# Patient Record
Sex: Female | Born: 1975 | Race: Black or African American | Hispanic: No | Marital: Single | Smoking: Current every day smoker
Health system: Southern US, Community
[De-identification: ages and names within clinical notes are randomized; demographics above are authoritative.]

## PROBLEM LIST (undated history)

## (undated) DIAGNOSIS — F329 Major depressive disorder, single episode, unspecified: Secondary | ICD-10-CM

## (undated) DIAGNOSIS — K219 Gastro-esophageal reflux disease without esophagitis: Secondary | ICD-10-CM

## (undated) DIAGNOSIS — F32A Depression, unspecified: Secondary | ICD-10-CM

---

## 2011-12-31 ENCOUNTER — Emergency Department (HOSPITAL_COMMUNITY)
Admission: EM | Admit: 2011-12-31 | Discharge: 2011-12-31 | Disposition: A | Discharge status: Home / Self Care | Payer: No Typology Code available for payment source | Attending: Emergency Medicine | Admitting: Emergency Medicine

## 2011-12-31 ENCOUNTER — Emergency Department (HOSPITAL_COMMUNITY): Payer: No Typology Code available for payment source

## 2011-12-31 ENCOUNTER — Encounter (HOSPITAL_COMMUNITY): Payer: Self-pay | Admitting: *Deleted

## 2011-12-31 DIAGNOSIS — F329 Major depressive disorder, single episode, unspecified: Secondary | ICD-10-CM

## 2011-12-31 DIAGNOSIS — F3289 Other specified depressive episodes: Secondary | ICD-10-CM | POA: Insufficient documentation

## 2011-12-31 DIAGNOSIS — Y92410 Unspecified street and highway as the place of occurrence of the external cause: Secondary | ICD-10-CM | POA: Insufficient documentation

## 2011-12-31 DIAGNOSIS — F1910 Other psychoactive substance abuse, uncomplicated: Secondary | ICD-10-CM

## 2011-12-31 HISTORY — DX: Depression, unspecified: F32.A

## 2011-12-31 HISTORY — DX: Major depressive disorder, single episode, unspecified: F32.9

## 2011-12-31 HISTORY — DX: Gastro-esophageal reflux disease without esophagitis: K21.9

## 2011-12-31 LAB — COMPREHENSIVE METABOLIC PANEL
ALT: 10 U/L (ref 0–35)
AST: 15 U/L (ref 0–37)
Calcium: 9.6 mg/dL (ref 8.4–10.5)
GFR calc Af Amer: >90 mL/min (ref >90)
Sodium: 139 mEq/L (ref 135–145)
Total Protein: 7.2 g/dL (ref 6.0–8.3)

## 2011-12-31 LAB — CBC WITH DIFFERENTIAL/PLATELET
Basophils Absolute: 0 10*3/uL (ref 0.0–0.1)
Eosinophils Absolute: 0.3 10*3/uL (ref 0.0–0.7)
Eosinophils Relative: 5 % (ref 0–5)
MCH: 33.5 pg (ref 26.0–34.0)
MCHC: 34.3 g/dL (ref 30.0–36.0)
MCV: 97.6 fL (ref 78.0–100.0)
Platelets: 298 10*3/uL (ref 150–400)
RDW: 13.4 % (ref 11.5–15.5)

## 2011-12-31 LAB — RAPID URINE DRUG SCREEN, HOSP PERFORMED
Cocaine: POSITIVE — AB
Opiates: NOT DETECTED
Tetrahydrocannabinol: POSITIVE — AB

## 2011-12-31 LAB — POCT PREGNANCY, URINE: Preg Test, Ur: NEGATIVE

## 2011-12-31 LAB — URINE MICROSCOPIC-ADD ON

## 2011-12-31 LAB — URINALYSIS, ROUTINE W REFLEX MICROSCOPIC
Bilirubin Urine: NEGATIVE
Nitrite: NEGATIVE
Specific Gravity, Urine: 1.011 (ref 1.005–1.030)
pH: 6 (ref 5.0–8.0)

## 2011-12-31 LAB — ETHANOL: Alcohol, Ethyl (B): <11 mg/dL (ref 0–11)

## 2011-12-31 MED ORDER — IBUPROFEN 200 MG PO TABS
400.0000 mg | ORAL_TABLET | Freq: Once | ORAL | Status: AC
  Administered 2011-12-31: 400 mg via ORAL
  Filled 2011-12-31: qty 2
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
## 2011-12-31 NOTE — ED Notes (Signed)
Pt very tearful on arrival to room 09. No SI/HI. Pt states she has not had her depression meds in about a week.Marland KitchenMarland Kitchen
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
## 2011-12-31 NOTE — ED Notes (Signed)
Patient advised ACT member will see her at approx [phone].

## 2012-10-21 ENCOUNTER — Encounter (HOSPITAL_COMMUNITY): Payer: Self-pay | Admitting: Emergency Medicine

## 2012-10-21 ENCOUNTER — Emergency Department (INDEPENDENT_AMBULATORY_CARE_PROVIDER_SITE_OTHER)
Admission: EM | Admit: 2012-10-21 | Discharge: 2012-10-21 | Disposition: A | Discharge status: Home / Self Care | Payer: Self-pay | Source: Home / Self Care | Attending: Family Medicine | Admitting: Family Medicine

## 2012-10-21 DIAGNOSIS — G5620 Lesion of ulnar nerve, unspecified upper limb: Secondary | ICD-10-CM

## 2012-10-21 DIAGNOSIS — G5621 Lesion of ulnar nerve, right upper limb: Secondary | ICD-10-CM

## 2012-10-21 MED ORDER — KETOROLAC TROMETHAMINE 60 MG/2ML IM SOLN
60.0000 mg | Freq: Once | INTRAMUSCULAR | Status: AC
  Administered 2012-10-21: 60 mg via INTRAMUSCULAR

## 2012-10-21 MED ORDER — TRAMADOL HCL 50 MG PO TABS: 50.0000 mg | ORAL_TABLET | Freq: Four times a day (QID) | ORAL | Status: DC | PRN

## 2012-10-21 MED ORDER — METHYLPREDNISOLONE ACETATE 40 MG/ML IJ SUSP
80.0000 mg | Freq: Once | INTRAMUSCULAR | Status: AC
  Administered 2012-10-21: 80 mg via INTRAMUSCULAR

## 2012-10-21 MED ORDER — METHYLPREDNISOLONE ACETATE 80 MG/ML IJ SUSP
INTRAMUSCULAR | Status: AC
  Filled 2012-10-21: qty 1

## 2012-10-21 MED ORDER — KETOROLAC TROMETHAMINE 60 MG/2ML IM SOLN
INTRAMUSCULAR | Status: AC
  Filled 2012-10-21: qty 2

## 2012-10-21 MED ORDER — NAPROXEN 500 MG PO TABS: 500.0000 mg | ORAL_TABLET | Freq: Two times a day (BID) | ORAL | Status: DC

## 2012-10-21 NOTE — ED Provider Notes (Signed)
Medical screening examination/treatment/procedure(s) were performed by resident physician or non-physician practitioner and as supervising physician I was immediately available for consultation/collaboration.   KINDL,JAMES DOUGLAS MD.   James D Kindl, MD 10/21/12 2149 

## 2012-10-21 NOTE — ED Notes (Signed)
Pt c/o right arm/hand pain onset 3 weeks... Reports she's having pain that begins from elbow and shoots down to hand and fingers... Denies inj/trauma... 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 Alert w/no signs of acute distress.

## 2012-10-21 NOTE — ED Provider Notes (Signed)
CSN: [id number]     Arrival date & time 10/21/12  1706 History     First MD Initiated Contact with Patient 10/21/12 1749     Chief Complaint  Patient presents with  . Hand Pain   (Consider location/radiation/quality/duration/timing/severity/associated sxs/prior Treatment) HPI Comments: 37 year old female presents complaining of pain in her right forearm and hand for the past 3 weeks. 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 The pain seems to originate in the elbow and is much worse at night. She feels like her arm is swollen and very heavy. She has tried soaking it and taking BC powders but these have not helped. She has never experienced anything quite like this before. She admits to some mild pain in the left but nowhere near as bad as her right hand.  Patient is a 37 y.o. female presenting with hand pain.  Hand Pain Pertinent negatives include no chest pain, no abdominal pain and no shortness of breath.    Past Medical History  Diagnosis Date  . Depression   . GERD (gastroesophageal reflux disease)    History reviewed. No pertinent past surgical history. No family history on file. History  Substance Use Topics  . Smoking status: Not on file  . Smokeless tobacco: Not on file  . Alcohol Use: No   OB History   Grav Para Term Preterm Abortions TAB SAB Ect Mult Living                 Review of Systems  Constitutional: Negative for fever and chills.  Eyes: Negative for visual disturbance.  Respiratory: Negative for cough and shortness of breath.   Cardiovascular: Negative for chest pain, palpitations and leg swelling.  Gastrointestinal: Negative for nausea, vomiting and abdominal pain.  Endocrine: Negative for polydipsia and polyuria.  Genitourinary: Negative for dysuria, urgency and frequency.  Musculoskeletal: Negative for myalgias and arthralgias.       Arm pain, see history of present illness  Skin: Negative for rash.  Neurological:  Negative for dizziness, weakness and light-headedness.    Allergies  Review of patient's allergies indicates no known allergies.  Home Medications   Current Outpatient Rx  Name  Route  Sig  Dispense  Refill  . clonazePAM (KLONOPIN) 0.5 MG tablet   Oral   Take 0.5 mg by mouth 4 (four) times daily.         . hydrOXYzine (ATARAX/VISTARIL) 25 MG tablet   Oral   Take 25 mg by mouth at bedtime.         . naproxen (NAPROSYN) 500 MG tablet   Oral   Take 1 tablet (500 mg total) by mouth 2 (two) times daily.   60 tablet   0   . ranitidine (ZANTAC) 150 MG tablet   Oral   Take 150 mg by mouth 2 (two) times daily.         . traMADol (ULTRAM) 50 MG tablet   Oral   Take 1 tablet (50 mg total) by mouth every 6 (six) hours as needed for pain.   30 tablet   0    BP 149/87  Pulse 80  Temp(Src) 98.3 F (36.8 C) (Oral)  Resp 16  SpO2 100%  LMP 10/02/2012 Physical Exam  Constitutional: She is oriented to person, place, and time. She appears well-developed and well-nourished. No distress.  HENT:  Head: Normocephalic and atraumatic.  Neck: Normal range of motion. Neck supple.  Musculoskeletal:  Right elbow: She exhibits normal range of motion, no swelling, no effusion, no deformity and no laceration. Tenderness (positive Tinel's sign in the elbow) found. Radial head, medial epicondyle and lateral epicondyle tenderness noted.       Right wrist: She exhibits no tenderness and no swelling.  Neurological: She is alert and oriented to person, place, and time. Coordination normal.  Skin: Skin is warm and dry. No rash noted. She is not diaphoretic.  Psychiatric: She has a normal mood and affect. Judgment normal.    ED Course   Procedures (including critical care time)  Labs Reviewed - No data to display No results found. 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 With the positive  Tinel's sign, this points to cubital tunnel syndrome. Giving 80 mg of Depo-Medrol and 60 mg of Toradol now. Start naproxen twice a day and tramadol as needed. She will followup with orthopedics if this does not improve.   Meds ordered this encounter  Medications  . naproxen (NAPROSYN) 500 MG tablet    Sig: Take 1 tablet (500 mg total) by mouth 2 (two) times daily.    Dispense:  60 tablet    Refill:  0  . traMADol (ULTRAM) 50 MG tablet    Sig: Take 1 tablet (50 mg total) by mouth every 6 (six) hours as needed for pain.    Dispense:  30 tablet    Refill:  0  . ketorolac (TORADOL) injection 60 mg    Sig:   . methylPREDNISolone acetate (DEPO-MEDROL) injection 80 mg    Sig:      Graylon Good, PA-C 10/21/12 1840

## 2013-03-17 ENCOUNTER — Emergency Department (INDEPENDENT_AMBULATORY_CARE_PROVIDER_SITE_OTHER)
Admission: EM | Admit: 2013-03-17 | Discharge: 2013-03-17 | Disposition: A | Discharge status: Home / Self Care | Payer: Self-pay | Source: Home / Self Care | Attending: Emergency Medicine | Admitting: Emergency Medicine

## 2013-03-17 ENCOUNTER — Encounter (HOSPITAL_COMMUNITY): Payer: Self-pay | Admitting: Emergency Medicine

## 2013-03-17 ENCOUNTER — Emergency Department (INDEPENDENT_AMBULATORY_CARE_PROVIDER_SITE_OTHER): Payer: Self-pay

## 2013-03-17 DIAGNOSIS — R591 Generalized enlarged lymph nodes: Secondary | ICD-10-CM

## 2013-03-17 DIAGNOSIS — R599 Enlarged lymph nodes, unspecified: Secondary | ICD-10-CM

## 2013-03-17 LAB — CBC WITH DIFFERENTIAL/PLATELET
Basophils Absolute: 0 10*3/uL (ref 0.0–0.1)
Basophils Relative: 1 % (ref 0–1)
Eosinophils Absolute: 0.3 10*3/uL (ref 0.0–0.7)
Hemoglobin: 14 g/dL (ref 12.0–15.0)
MCH: 34.1 pg — ABNORMAL HIGH (ref 26.0–34.0)
MCHC: 34.7 g/dL (ref 30.0–36.0)
Neutro Abs: 1.4 10*3/uL — ABNORMAL LOW (ref 1.7–7.7)
Neutrophils Relative %: 35 % — ABNORMAL LOW (ref 43–77)
Platelets: 280 10*3/uL (ref 150–400)
RDW: 13.4 % (ref 11.5–15.5)

## 2013-03-17 LAB — T4, FREE: Free T4: 0.89 ng/dL (ref 0.80–1.80)

## 2013-03-17 LAB — TSH: TSH: 0.493 u[IU]/mL (ref 0.350–4.500)

## 2013-03-17 MED ORDER — NAPROXEN 500 MG PO TABS: 500.0000 mg | ORAL_TABLET | Freq: Two times a day (BID) | ORAL | Status: DC

## 2013-03-17 NOTE — ED Notes (Signed)
Difficulty swallowing for 2 days, intermittent left chest tightness.  Denies diaphoresis, no sob, no nausea, no vomiting.  "discomfort" in throat , painful swallowing.

## 2013-03-17 NOTE — ED Notes (Signed)
Waiting for EKG to be done.

## 2013-03-17 NOTE — ED Provider Notes (Signed)
Medical screening examination/treatment/procedure(s) were performed by non-physician practitioner and as supervising physician I was immediately available for consultation/collaboration.  Leslee Home, M.D.  Reuben Likes, MD 03/17/13 2236

## 2013-03-17 NOTE — ED Provider Notes (Signed)
CSN: [id number]     Arrival date & time 03/17/13  4098 History   First MD Initiated Contact with Patient 03/17/13 0912     Chief Complaint  Patient presents with  . Dysphagia   (Consider location/radiation/quality/duration/timing/severity/associated sxs/prior Treatment) HPI Comments: 37 year old female presents complaining of subjective neck swelling and difficulty swallowing. This has been going on for 2 days, getting progressively worse. She feels a sensation of swelling in her bilateral neck below the angle of the jaw and this started to have difficulty swallowing food. Other than that, she admits to increased stress, headache, and some very mild chest tightness over the past week. The chest tightness is mild, intermittent, nonradiating, and not associated with any nausea, vomiting, shortness of breath, lightheadedness. She has a history of anxiety for which she has taken Klonopin in the past.   Past Medical History  Diagnosis Date  . Depression   . GERD (gastroesophageal reflux disease)    History reviewed. No pertinent past surgical history. History reviewed. No pertinent family history. History  Substance Use Topics  . Smoking status: Current Every Day Smoker  . Smokeless tobacco: Not on file  . Alcohol Use: No   OB History   Grav Para Term Preterm Abortions TAB SAB Ect Mult Living                 Review of Systems  Constitutional: Negative for fever and chills.  HENT: Positive for trouble swallowing. Negative for congestion, ear pain, rhinorrhea, sneezing and sore throat.   Eyes: Negative for visual disturbance.  Respiratory: Positive for chest tightness. Negative for cough and shortness of breath.   Cardiovascular: Negative for chest pain, palpitations and leg swelling.  Gastrointestinal: Negative for nausea, vomiting and abdominal pain.  Endocrine: Negative for polydipsia and polyuria.  Genitourinary: Negative for dysuria, urgency and frequency.  Musculoskeletal:  Negative for arthralgias and myalgias.  Skin: Negative for rash.  Neurological: Negative for dizziness, weakness and light-headedness.  Hematological: Positive for adenopathy.  Psychiatric/Behavioral: The patient is nervous/anxious (stress).     Allergies  Review of patient's allergies indicates no known allergies.  Home Medications   Current Outpatient Rx  Name  Route  Sig  Dispense  Refill  . clonazePAM (KLONOPIN) 0.5 MG tablet   Oral   Take 0.5 mg by mouth 4 (four) times daily.         . hydrOXYzine (ATARAX/VISTARIL) 25 MG tablet   Oral   Take 25 mg by mouth at bedtime.         . naproxen (NAPROSYN) 500 MG tablet   Oral   Take 1 tablet (500 mg total) by mouth 2 (two) times daily.   60 tablet   0   . naproxen (NAPROSYN) 500 MG tablet   Oral   Take 1 tablet (500 mg total) by mouth 2 (two) times daily.   30 tablet   0   . ranitidine (ZANTAC) 150 MG tablet   Oral   Take 150 mg by mouth 2 (two) times daily.         . traMADol (ULTRAM) 50 MG tablet   Oral   Take 1 tablet (50 mg total) by mouth every 6 (six) hours as needed for pain.   30 tablet   0    BP 106/86  Pulse 86  Temp(Src) 98 F (36.7 C) (Oral)  Resp 16  SpO2 100%  LMP 03/03/2013 Physical Exam  Nursing note and vitals reviewed. Constitutional: She is oriented to person, place,  and time. Vital signs are normal. She appears well-developed and well-nourished. No distress.  HENT:  Head: Normocephalic and atraumatic.  Mouth/Throat: Oropharynx is clear and moist. No oropharyngeal exudate.  Neck: Normal range of motion. Neck supple. No thyromegaly present.  Cardiovascular: Normal rate, regular rhythm and normal heart sounds.  Exam reveals no gallop and no friction rub.   No murmur heard. Pulmonary/Chest: Effort normal and breath sounds normal. No respiratory distress. She has no wheezes. She has no rales.  Lymphadenopathy:    She has cervical adenopathy (tonsillar, mildly tender).  Neurological:  She is alert and oriented to person, place, and time. She has normal strength. Coordination normal.  Skin: Skin is warm and dry. No rash noted. She is not diaphoretic.  Psychiatric: She has a normal mood and affect. Judgment normal.    ED Course  Procedures (including critical care time) Labs Review Labs Reviewed  CBC WITH DIFFERENTIAL - Abnormal; Notable for the following:    MCH 34.1 (*)    Neutrophils Relative % 35 (*)    Neutro Abs 1.4 (*)    Lymphocytes Relative 47 (*)    Eosinophils Relative 7 (*)    All other components within normal limits  TSH  T4, FREE   Imaging Review Dg Neck Soft Tissue  03/17/2013   CLINICAL DATA:  Difficulty swallowing for 2 days.  EXAM: NECK SOFT TISSUES - 1+ VIEW  COMPARISON:  None.  FINDINGS: There is no evidence of retropharyngeal soft tissue swelling or epiglottic enlargement. The cervical airway is unremarkable and no radio-opaque foreign body identified. Annular calcification is noted in the midcervical spine.  IMPRESSION: Unremarkable appearance of the cervical airway.   Electronically Signed   By: Sebastian Ache   On: 03/17/2013 10:17   Dg Chest 2 View  03/17/2013   CLINICAL DATA:  Chest pain  EXAM: CHEST  2 VIEW  COMPARISON:  None.  FINDINGS: Lungs are clear. Heart size and pulmonary vascularity are normal. No adenopathy. No pneumothorax. There is slight degenerative change in the thoracic spine. There is mild thoracolumbar dextroscoliosis.  Incidental note is made of spina bifida occulta at T1.  IMPRESSION: No edema or consolidation.   Electronically Signed   By: Bretta Bang M.D.   On: 03/17/2013 09:56    EKG:   Date: 03/17/2013  Rate: 61  Rhythm: normal sinus rhythm  QRS Axis: normal  Intervals: normal  ST/T Wave abnormalities: nonspecific T wave changes  Conduction Disutrbances:none  Narrative Interpretation: non-ischemic   Old EKG Reviewed: none available               MDM   1. Lymphadenopathy    X-rays are normal.  Exam is normal except for some very mild lymphadenopathy. CBC does not indicate any infection. Treat with anti-inflammatories. Followup if any worsening. TSH and free T4 are pending.   New Prescriptions   NAPROXEN (NAPROSYN) 500 MG TABLET    Take 1 tablet (500 mg total) by mouth 2 (two) times daily.      Graylon Good, PA-C 03/17/13 1029

## 2013-03-21 NOTE — ED Notes (Signed)
Free T4 0.89, TSH 0.493. Both within normal limits.  Sent to [facility] NP basket for review. Maria Bowers 03/21/2013

## 2013-05-25 ENCOUNTER — Emergency Department (HOSPITAL_COMMUNITY)
Admission: EM | Admit: 2013-05-25 | Discharge: 2013-05-25 | Disposition: A | Discharge status: Home / Self Care | Payer: Self-pay | Attending: Emergency Medicine | Admitting: Emergency Medicine

## 2013-05-25 ENCOUNTER — Encounter (HOSPITAL_COMMUNITY): Payer: Self-pay | Admitting: Emergency Medicine

## 2013-05-25 DIAGNOSIS — Z791 Long term (current) use of non-steroidal anti-inflammatories (NSAID): Secondary | ICD-10-CM | POA: Insufficient documentation

## 2013-05-25 DIAGNOSIS — A599 Trichomoniasis, unspecified: Secondary | ICD-10-CM | POA: Insufficient documentation

## 2013-05-25 DIAGNOSIS — N760 Acute vaginitis: Secondary | ICD-10-CM | POA: Insufficient documentation

## 2013-05-25 DIAGNOSIS — F3289 Other specified depressive episodes: Secondary | ICD-10-CM | POA: Insufficient documentation

## 2013-05-25 DIAGNOSIS — F329 Major depressive disorder, single episode, unspecified: Secondary | ICD-10-CM | POA: Insufficient documentation

## 2013-05-25 DIAGNOSIS — B9689 Other specified bacterial agents as the cause of diseases classified elsewhere: Secondary | ICD-10-CM | POA: Insufficient documentation

## 2013-05-25 DIAGNOSIS — Z79899 Other long term (current) drug therapy: Secondary | ICD-10-CM | POA: Insufficient documentation

## 2013-05-25 DIAGNOSIS — L259 Unspecified contact dermatitis, unspecified cause: Secondary | ICD-10-CM | POA: Insufficient documentation

## 2013-05-25 DIAGNOSIS — K219 Gastro-esophageal reflux disease without esophagitis: Secondary | ICD-10-CM | POA: Insufficient documentation

## 2013-05-25 DIAGNOSIS — F17200 Nicotine dependence, unspecified, uncomplicated: Secondary | ICD-10-CM | POA: Insufficient documentation

## 2013-05-25 DIAGNOSIS — H9210 Otorrhea, unspecified ear: Secondary | ICD-10-CM | POA: Insufficient documentation

## 2013-05-25 DIAGNOSIS — A499 Bacterial infection, unspecified: Secondary | ICD-10-CM | POA: Insufficient documentation

## 2013-05-25 LAB — WET PREP, GENITAL: Yeast Wet Prep HPF POC: NONE SEEN

## 2013-05-25 MED ORDER — METRONIDAZOLE 500 MG PO TABS: 500.0000 mg | ORAL_TABLET | Freq: Two times a day (BID) | ORAL | Status: AC

## 2013-05-25 MED ORDER — LIDOCAINE HCL (PF) 1 % IJ SOLN
INTRAMUSCULAR | Status: AC
  Administered 2013-05-25: 0.9 mL
  Filled 2013-05-25: qty 5

## 2013-05-25 MED ORDER — AZITHROMYCIN 250 MG PO TABS
1000.0000 mg | ORAL_TABLET | Freq: Once | ORAL | Status: AC
  Administered 2013-05-25: 1000 mg via ORAL
  Filled 2013-05-25: qty 4

## 2013-05-25 MED ORDER — CEFTRIAXONE SODIUM 250 MG IJ SOLR
250.0000 mg | Freq: Once | INTRAMUSCULAR | Status: AC
  Administered 2013-05-25: 250 mg via INTRAMUSCULAR
  Filled 2013-05-25: qty 250

## 2013-05-25 MED ORDER — CEPHALEXIN 500 MG PO CAPS: 500.0000 mg | ORAL_CAPSULE | Freq: Four times a day (QID) | ORAL | Status: AC

## 2013-05-25 NOTE — ED Notes (Signed)
Pt has tried Neosporin, A&D ointment. Took Benadryl 25 mg last night and applied Ketoconazole cream 2% today.  All with no relief.

## 2013-05-25 NOTE — ED Notes (Signed)
Patient having reaction to reaction to perm that she used on Monday.  Patient now with red ears and swelling and now she is itching all over her body.

## 2013-05-25 NOTE — Discharge Instructions (Signed)
Contact Dermatitis °Contact dermatitis is a reaction to certain substances that touch the skin. Contact dermatitis can be either irritant contact dermatitis or allergic contact dermatitis. Irritant contact dermatitis does not require previous exposure to the substance for a reaction to occur. Allergic contact dermatitis only occurs if you have been exposed to the substance before. Upon a repeat exposure, your body reacts to the substance.  °CAUSES  °Many substances can cause contact dermatitis. Irritant dermatitis is most commonly caused by repeated exposure to mildly irritating substances, such as: °· Makeup. °· Soaps. °· Detergents. °· Bleaches. °· Acids. °· Metal salts, such as nickel. °Allergic contact dermatitis is most commonly caused by exposure to: °· Poisonous plants. °· Chemicals (deodorants, shampoos). °· Jewelry. °· Latex. °· Neomycin in triple antibiotic cream. °· Preservatives in products, including clothing. °SYMPTOMS  °The area of skin that is exposed may develop: °· Dryness or flaking. °· Redness. °· Cracks. °· Itching. °· Pain or a burning sensation. °· Blisters. °With allergic contact dermatitis, there may also be swelling in areas such as the eyelids, mouth, or genitals.  °DIAGNOSIS  °Your caregiver can usually tell what the problem is by doing a physical exam. In cases where the cause is uncertain and an allergic contact dermatitis is suspected, a patch skin test may be performed to help determine the cause of your dermatitis. °TREATMENT °Treatment includes protecting the skin from further contact with the irritating substance by avoiding that substance if possible. Barrier creams, powders, and gloves may be helpful. Your caregiver may also recommend: °· Steroid creams or ointments applied 2 times daily. For best results, soak the rash area in cool water for 20 minutes. Then apply the medicine. Cover the area with a plastic wrap. You can store the steroid cream in the refrigerator for a "chilly"  effect on your rash. That may decrease itching. Oral steroid medicines may be needed in more severe cases. °· Antibiotics or antibacterial ointments if a skin infection is present. °· Antihistamine lotion or an antihistamine taken by mouth to ease itching. °· Lubricants to keep moisture in your skin. °· Burow's solution to reduce redness and soreness or to dry a weeping rash. Mix one packet or tablet of solution in 2 cups cool water. Dip a clean washcloth in the mixture, wring it out a bit, and put it on the affected area. Leave the cloth in place for 30 minutes. Do this as often as possible throughout the day. °· Taking several cornstarch or baking soda baths daily if the area is too large to cover with a washcloth. °Harsh chemicals, such as alkalis or acids, can cause skin damage that is like a burn. You should flush your skin for 15 to 20 minutes with cold water after such an exposure. You should also seek immediate medical care after exposure. Bandages (dressings), antibiotics, and pain medicine may be needed for severely irritated skin.  °HOME CARE INSTRUCTIONS °· Avoid the substance that caused your reaction. °· Keep the area of skin that is affected away from hot water, soap, sunlight, chemicals, acidic substances, or anything else that would irritate your skin. °· Do not scratch the rash. Scratching may cause the rash to become infected. °· You may take cool baths to help stop the itching. °· Only take over-the-counter or prescription medicines as directed by your caregiver. °· See your caregiver for follow-up care as directed to make sure your skin is healing properly. °SEEK MEDICAL CARE IF:  °· Your condition is not better after 3   days of treatment.  You seem to be getting worse.  You see signs of infection such as swelling, tenderness, redness, soreness, or warmth in the affected area.  You have any problems related to your medicines. Document Released: 03/10/2000 Document Revised: 06/05/2011  Document Reviewed: 08/16/2010 [facility] Patient Information 2014 [place], [place]. Sexually Transmitted Disease A sexually transmitted disease (STD) is a disease or infection that may be passed (transmitted) from person to person, usually during sexual activity. This may happen by way of saliva, semen, blood, vaginal mucus, or urine. Common STDs include:   Gonorrhea.   Chlamydia.   Syphilis.   HIV and AIDS.   Genital herpes.   Hepatitis B and C.   Trichomonas.   Human papillomavirus (HPV).   Pubic lice.   Scabies.  Mites.  Bacterial vaginosis. WHAT ARE CAUSES OF STDs? An STD may be caused by bacteria, a virus, or parasites. STDs are often transmitted during sexual activity if one person is infected. However, they may also be transmitted through nonsexual means. STDs may be transmitted after:   Sexual intercourse with an infected person.   Sharing sex toys with an infected person.   Sharing needles with an infected person or using unclean piercing or tattoo needles.  Having intimate contact with the genitals, mouth, or rectal areas of an infected person.   Exposure to infected fluids during birth. WHAT ARE THE SIGNS AND SYMPTOMS OF STDs? Different STDs have different symptoms. Some people may not have any symptoms. If symptoms are present, they may include:   Painful or bloody urination.   Pain in the pelvis, abdomen, vagina, anus, throat, or eyes.   Skin rash, itching, irritation, growths, sores (lesions), ulcerations, or warts in the genital or anal area.  Abnormal vaginal discharge with or without bad odor.   Penile discharge in men.   Fever.   Pain or bleeding during sexual intercourse.   Swollen glands in the groin area.   Yellow skin and eyes (jaundice). This is seen with hepatitis.   Swollen testicles.  Infertility.  Sores and blisters in the mouth. HOW ARE STDs DIAGNOSED? To make a diagnosis, your health care provider may:     Take a medical history.   Perform a physical exam.   Take a sample of any discharge for examination.  Swab the throat, cervix, opening to the penis, rectum, or vagina for testing.  Test a sample of your first morning urine.   Perform blood tests.   Perform a Pap smear, if this applies.   Perform a colposcopy.   Perform a laparoscopy.  HOW ARE STDs TREATED? Treatment depends on the STD. Some STDs may be treated but not cured.   Chlamydia, gonorrhea, trichomonas, and syphilis can be cured with antibiotics.   Genital herpes, hepatitis, and HIV can be treated, but not cured, with prescribed medicines. The medicines lessen symptoms.   Genital warts from HPV can be treated with medicine or by freezing, burning (electrocautery), or surgery. Warts may come back.   HPV cannot be cured with medicine or surgery. However, abnormal areas may be removed from the cervix, vagina, or vulva.   If your diagnosis is confirmed, your recent sexual partners need treatment. This is true even if they are symptom-free or have a negative culture or evaluation. They should not have sex until their health care providers say it is OK. HOW CAN I REDUCE MY RISK OF GETTING AN STD?  Use latex condoms, dental dams, and water-soluble lubricants during sexual activity.  Do not use petroleum jelly or oils.  Get vaccinated for HPV and hepatitis. If you have not received these vaccines in the past, talk to your health care provider about whether one or both might be right for you.   Avoid risky sex practices that can break the skin.  WHAT SHOULD I DO IF I THINK I HAVE AN STD?  See your health care provider.   Inform all sexual partners. They should be tested and treated for any STDs.  Do not have sex until your health care provider says it is OK. WHEN SHOULD I GET HELP? Seek immediate medical care if:  You develop severe abdominal pain.  You are a man and notice swelling or pain in the  testicles.  You are a woman and notice swelling or pain in your vagina. Document Released: 06/03/2002 Document Revised: 01/01/2013 Document Reviewed: 10/01/2012 [facility] Patient Information 2014 [place].

## 2013-05-25 NOTE — ED Provider Notes (Signed)
CSN: [id number]     Arrival date & time 05/25/13  2104 History  This chart was scribed for non-physician practitioner, Roxy Horseman, PA-C, working with Juliet Rude. Rubin Payor, MD by Charline Bills, ED Scribe. This patient was seen in room TR07C/TR07C and the patient's care was started at 9:39 PM.    Chief Complaint  Patient presents with  . Rash    The history is provided by the patient. No language interpreter was used.   HPI Comments: Maria Bowers is a 38 y.o. female who presents to the Emergency Department complaining of an allergic reaction from hair chemicals that she used about a week ago. Pt reports a constant burning sensation to her ears along with redness, swelling and discharge. She reports associated itching all over the body. She has tried Benadryl and OTC ointment without relief.   Pt has a secondary complaint of a yellow vaginal discharge onset before her LMP February 15. She states the discharge returned following her menstrual period. She denies abdominal pain.  Past Medical History  Diagnosis Date  . Depression   . GERD (gastroesophageal reflux disease)    History reviewed. No pertinent past surgical history. No family history on file. History  Substance Use Topics  . Smoking status: Current Every Day Smoker  . Smokeless tobacco: Not on file  . Alcohol Use: No   OB History   Grav Para Term Preterm Abortions TAB SAB Ect Mult Living                 Review of Systems  HENT: Negative for trouble swallowing.   Respiratory: Negative for shortness of breath.   Gastrointestinal: Negative for abdominal pain.  Genitourinary: Positive for vaginal discharge.  Skin: Positive for rash.      Allergies  Review of patient's allergies indicates no known allergies.  Home Medications   Current Outpatient Rx  Name  Route  Sig  Dispense  Refill  . clonazePAM (KLONOPIN) 0.5 MG tablet   Oral   Take 0.5 mg by mouth 4 (four) times daily.         . hydrOXYzine  (ATARAX/VISTARIL) 25 MG tablet   Oral   Take 25 mg by mouth at bedtime.         . naproxen (NAPROSYN) 500 MG tablet   Oral   Take 1 tablet (500 mg total) by mouth 2 (two) times daily.   60 tablet   0   . naproxen (NAPROSYN) 500 MG tablet   Oral   Take 1 tablet (500 mg total) by mouth 2 (two) times daily.   30 tablet   0   . ranitidine (ZANTAC) 150 MG tablet   Oral   Take 150 mg by mouth 2 (two) times daily.         . traMADol (ULTRAM) 50 MG tablet   Oral   Take 1 tablet (50 mg total) by mouth every 6 (six) hours as needed for pain.   30 tablet   0    Triage Vitals: BP 132/101  Pulse 90  Temp(Src) 98 F (36.7 C) (Oral)  Resp 18  Ht 5\' 5"  (1.651 m)  Wt 162 lb 9.6 oz (73.755 kg)  BMI 27.06 kg/m2  SpO2 100%  LMP 05/11/2013 Physical Exam  Nursing note and vitals reviewed. Constitutional: She is oriented to person, place, and time. She appears well-developed and well-nourished. No distress.  HENT:  Head: Normocephalic and atraumatic.  Bilateral ears moderately swollen, there is no mastoid tenderness, no discharge  or drainage  Eyes: EOM are normal.  Neck: Neck supple. No tracheal deviation present.  Cardiovascular: Normal rate.   Pulmonary/Chest: Effort normal. No respiratory distress.  Genitourinary:  Pelvic exam chaperoned by female ER tech/nurse, no right or left adnexal tenderness, no uterine tenderness, moderate amount white vaginal discharge, no bleeding, no CMT or friability, no foreign body, no injury to the external genitalia, no other significant findings   Musculoskeletal: Normal range of motion.  Neurological: She is alert and oriented to person, place, and time.  Skin: Skin is warm and dry. Rash noted.  Psychiatric: She has a normal mood and affect. Her behavior is normal.    ED Course  Procedures (including critical care time) DIAGNOSTIC STUDIES: Oxygen Saturation is 100% on RA, normal by my interpretation.    COORDINATION OF CARE: 9:42  PM-Discussed treatment plan with pt at bedside and pt agreed to plan.   Labs Review Labs Reviewed  WET PREP, GENITAL - Abnormal; Notable for the following:    Trich, Wet Prep MODERATE (*)    Clue Cells Wet Prep HPF POC FEW (*)    WBC, Wet Prep HPF POC FEW (*)    All other components within normal limits  GC/CHLAMYDIA PROBE AMP   Imaging Review No results found.   EKG Interpretation None      MDM   Final diagnoses:  Contact dermatitis  Trichomonas infection  BV (bacterial vaginosis)    Patient with contact dermatitis, Trichomonas, and BV. Will treat with Benadryl, and Keflex for contact dermatitis, as there could be a mild superimposed cellulitis. There is no mastoid tenderness, or signs of mastoiditis. Not diabetic, no evidence of otitis externa. Return precautions including instructions have been given regarding this. Additionally will treat Trichomonas and BV with metronidazole. Patient has also been given Rocephin and azithromycin in the emergency department. Patient is stable and ready for discharge. She is not in apparent distress.  I personally performed the services described in this documentation, which was scribed in my presence. The recorded information has been reviewed and is accurate.     Roxy Horsemanobert Aizlynn Digilio, PA-C 05/25/13 2354

## 2013-05-26 NOTE — ED Provider Notes (Signed)
Medical screening examination/treatment/procedure(s) were performed by non-physician practitioner and as supervising physician I was immediately available for consultation/collaboration.   EKG Interpretation None       Adonijah Baena R. Kedarius Aloisi, MD 05/26/13 0000 

## 2013-05-27 LAB — GC/CHLAMYDIA PROBE AMP
CT PROBE, AMP APTIMA: NEGATIVE
GC Probe RNA: NEGATIVE

## 2013-05-28 ENCOUNTER — Encounter (HOSPITAL_COMMUNITY): Payer: Self-pay | Admitting: Emergency Medicine

## 2013-05-28 ENCOUNTER — Emergency Department (HOSPITAL_COMMUNITY)
Admission: EM | Admit: 2013-05-28 | Discharge: 2013-05-28 | Disposition: A | Discharge status: Home / Self Care | Payer: Self-pay | Attending: Emergency Medicine | Admitting: Emergency Medicine

## 2013-05-28 DIAGNOSIS — Z8659 Personal history of other mental and behavioral disorders: Secondary | ICD-10-CM | POA: Insufficient documentation

## 2013-05-28 DIAGNOSIS — T7840XA Allergy, unspecified, initial encounter: Secondary | ICD-10-CM

## 2013-05-28 DIAGNOSIS — R21 Rash and other nonspecific skin eruption: Secondary | ICD-10-CM | POA: Insufficient documentation

## 2013-05-28 DIAGNOSIS — Z8719 Personal history of other diseases of the digestive system: Secondary | ICD-10-CM | POA: Insufficient documentation

## 2013-05-28 DIAGNOSIS — F17200 Nicotine dependence, unspecified, uncomplicated: Secondary | ICD-10-CM | POA: Insufficient documentation

## 2013-05-28 DIAGNOSIS — Z792 Long term (current) use of antibiotics: Secondary | ICD-10-CM | POA: Insufficient documentation

## 2013-05-28 DIAGNOSIS — L299 Pruritus, unspecified: Secondary | ICD-10-CM | POA: Insufficient documentation

## 2013-05-28 MED ORDER — PREDNISONE 10 MG PO TABS: 20.0000 mg | ORAL_TABLET | Freq: Two times a day (BID) | ORAL | Status: AC

## 2013-05-28 MED ORDER — METHYLPREDNISOLONE SODIUM SUCC 125 MG IJ SOLR
125.0000 mg | Freq: Once | INTRAMUSCULAR | Status: AC
  Administered 2013-05-28: 125 mg via INTRAVENOUS
  Filled 2013-05-28: qty 2

## 2013-05-28 MED ORDER — DIPHENHYDRAMINE HCL 50 MG/ML IJ SOLN
25.0000 mg | Freq: Once | INTRAMUSCULAR | Status: AC
  Administered 2013-05-28: 25 mg via INTRAVENOUS
  Filled 2013-05-28: qty 1

## 2013-05-28 NOTE — ED Provider Notes (Signed)
CSN: [id number]     Arrival date & time 05/28/13  0046 History   First MD Initiated Contact with Patient 05/28/13 [phone]     Chief Complaint  Patient presents with  . Allergic Reaction     (Consider location/radiation/quality/duration/timing/severity/associated sxs/prior Treatment) HPI Comments: The patient is a 38 year old female who presents with complaints of rash to the years and scalp. This started after using a permanent solution to straighten her hair. This rash is now extending from her ear is into her neck and chest. It is very itchy. She denies any throat swelling or difficulty breathing or swallowing.  Patient is a 38 y.o. female presenting with allergic reaction. The history is provided by the patient.  Allergic Reaction Presenting symptoms: itching and rash   Severity:  Moderate Relieved by:  Nothing Worsened by:  Nothing tried Ineffective treatments:  None tried   Past Medical History  Diagnosis Date  . Depression   . GERD (gastroesophageal reflux disease)    History reviewed. No pertinent past surgical history. History reviewed. No pertinent family history. History  Substance Use Topics  . Smoking status: Current Every Day Smoker  . Smokeless tobacco: Not on file  . Alcohol Use: No   OB History   Grav Para Term Preterm Abortions TAB SAB Ect Mult Living                 Review of Systems  Skin: Positive for itching and rash.  All other systems reviewed and are negative.      Allergies  Review of patient's allergies indicates no known allergies.  Home Medications   Current Outpatient Rx  Name  Route  Sig  Dispense  Refill  . cephALEXin (KEFLEX) 500 MG capsule   Oral   Take 1 capsule (500 mg total) by mouth 4 (four) times daily.   40 capsule   0   . metroNIDAZOLE (FLAGYL) 500 MG tablet   Oral   Take 1 tablet (500 mg total) by mouth 2 (two) times daily.   14 tablet   0   . predniSONE (DELTASONE) 10 MG tablet   Oral   Take 2 tablets (20 mg  total) by mouth 2 (two) times daily.   12 tablet   0    BP 107/61  Pulse 71  Temp(Src) 97.8 F (36.6 C) (Oral)  Resp 20  SpO2 100%  LMP 05/11/2013 Physical Exam  Nursing note and vitals reviewed. Constitutional: She is oriented to person, place, and time. She appears well-developed and well-nourished. No distress.  HENT:  Head: Normocephalic and atraumatic.  Neck: Normal range of motion. Neck supple.  Cardiovascular: Normal rate and regular rhythm.  Exam reveals no gallop and no friction rub.   No murmur heard. Pulmonary/Chest: Effort normal and breath sounds normal. No respiratory distress. She has no wheezes.  Abdominal: Soft. Bowel sounds are normal. She exhibits no distension. There is no tenderness.  Musculoskeletal: Normal range of motion.  Neurological: She is alert and oriented to person, place, and time.  Skin: Skin is warm and dry. Rash noted. She is not diaphoretic.  There is a macular rash present to both ears, neck, extending into the upper chest.    ED Course  Procedures (including critical care time) Labs Review Labs Reviewed - No data to display Imaging Review No results found.   EKG Interpretation None      MDM   Final diagnoses:  Allergic reaction    This appears to be an allergic reaction  to the solution which she used to straighten her hair. Is given Solu-Medrol and Benadryl and is improving. Will be discharged with Benadryl and prednisone. I will    Geoffery Lyons, MD 05/28/13 [phone]

## 2013-05-28 NOTE — ED Notes (Signed)
Patient presents with c/o allergic reaction.  Swelling, rash, blisters all started after being seen here on 3/1

## 2013-05-28 NOTE — Discharge Instructions (Signed)
Prednisone 20 mg twice daily for the next 3 days.  Benadryl 25 mg every 6 hours for the next 3 days.  Return to the ER if you develop difficulty breathing or swallowing, or worsening of your symptoms.   Rash A rash is a change in the color or texture of your skin. There are many different types of rashes. You may have other problems that accompany your rash. CAUSES   Infections.  Allergic reactions. This can include allergies to pets or foods.  Certain medicines.  Exposure to certain chemicals, soaps, or cosmetics.  Heat.  Exposure to poisonous plants.  Tumors, both cancerous and noncancerous. SYMPTOMS   Redness.  Scaly skin.  Itchy skin.  Dry or cracked skin.  Bumps.  Blisters.  Pain. DIAGNOSIS  Your caregiver may do a physical exam to determine what type of rash you have. A skin sample (biopsy) may be taken and examined under a microscope. TREATMENT  Treatment depends on the type of rash you have. Your caregiver may prescribe certain medicines. For serious conditions, you may need to see a skin doctor (dermatologist). HOME CARE INSTRUCTIONS   Avoid the substance that caused your rash.  Do not scratch your rash. This can cause infection.  You may take cool baths to help stop itching.  Only take over-the-counter or prescription medicines as directed by your caregiver.  Keep all follow-up appointments as directed by your caregiver. SEEK IMMEDIATE MEDICAL CARE IF:  You have increasing pain, swelling, or redness.  You have a fever.  You have new or severe symptoms.  You have body aches, diarrhea, or vomiting.  Your rash is not better after 3 days. MAKE SURE YOU:  Understand these instructions.  Will watch your condition.  Will get help right away if you are not doing well or get worse. Document Released: 03/03/2002 Document Revised: 06/05/2011 Document Reviewed: 12/26/2010 [facility] Patient Information 2014 [place], [place].

## 2014-08-05 IMAGING — CR DG CHEST 2V
2 series · 2 of 2 positions shown · non-contrast
Comparison: None.

CLINICAL DATA: Chest pain

EXAM:
CHEST  2 VIEW

[view not recorded (1 of 2)]
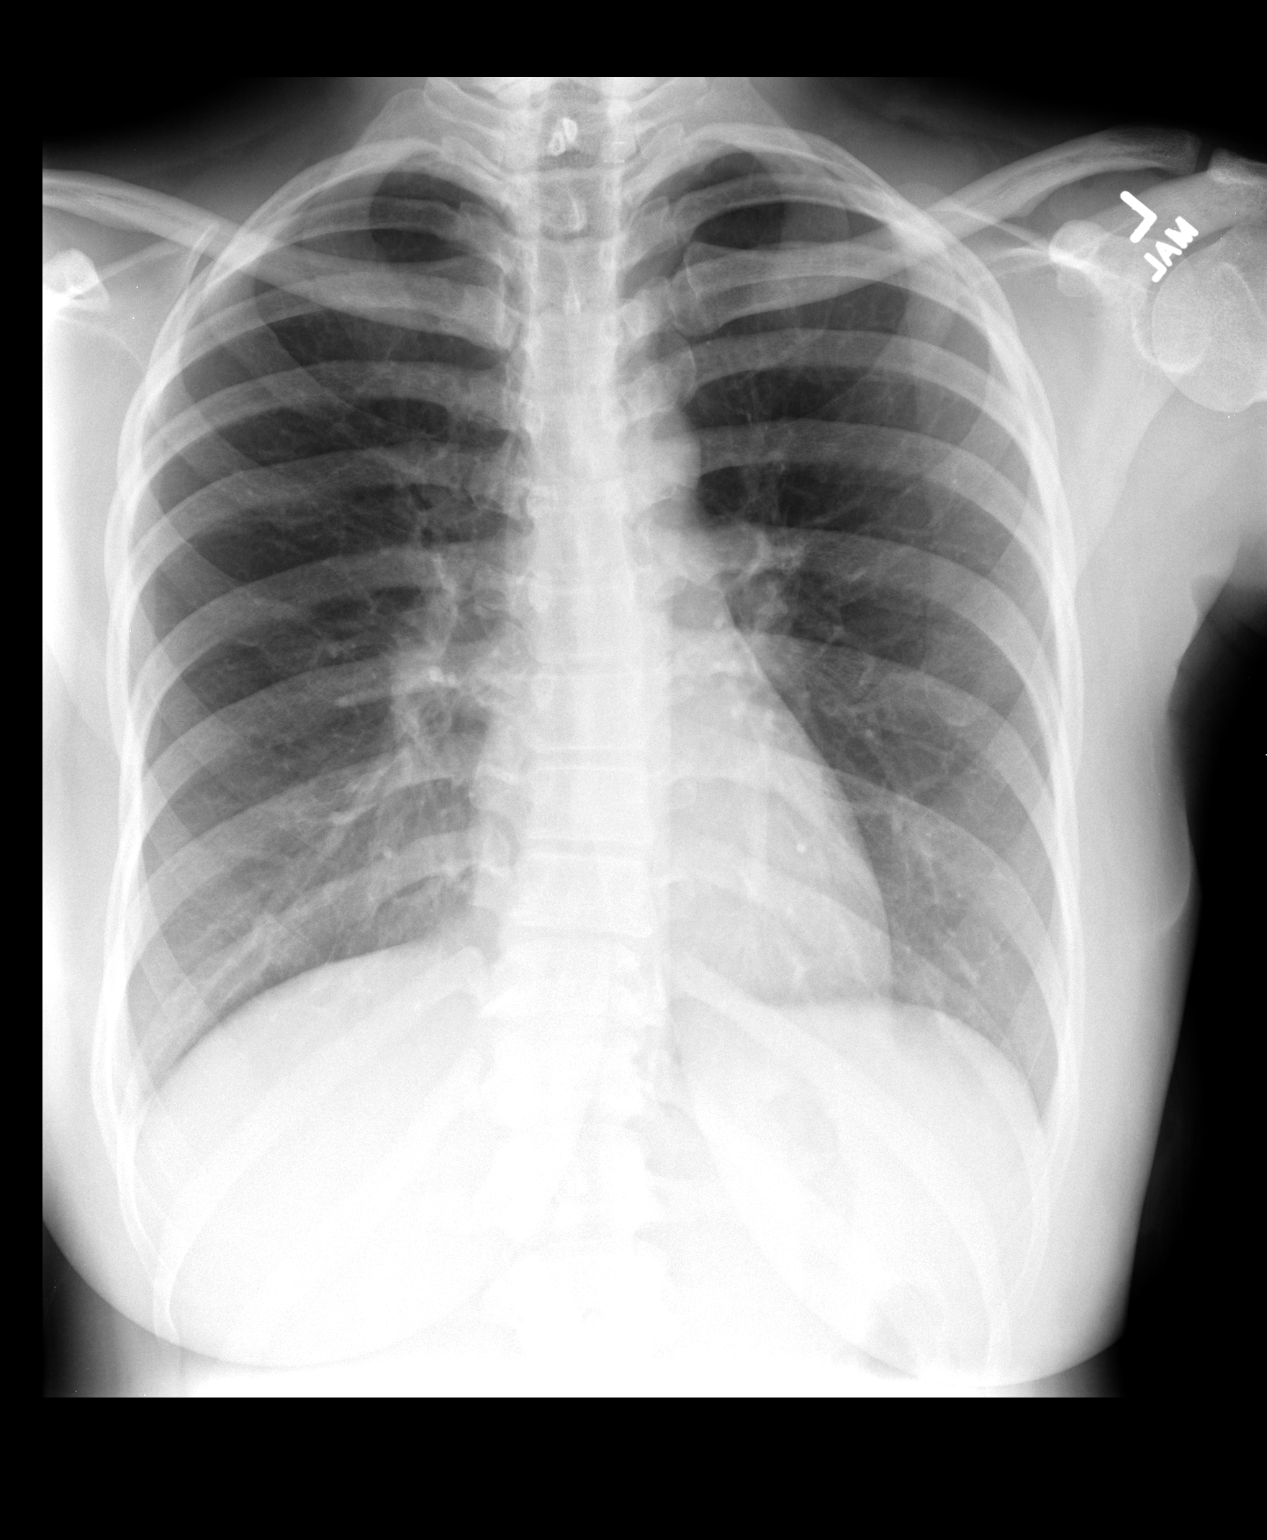

[view not recorded (2 of 2)]
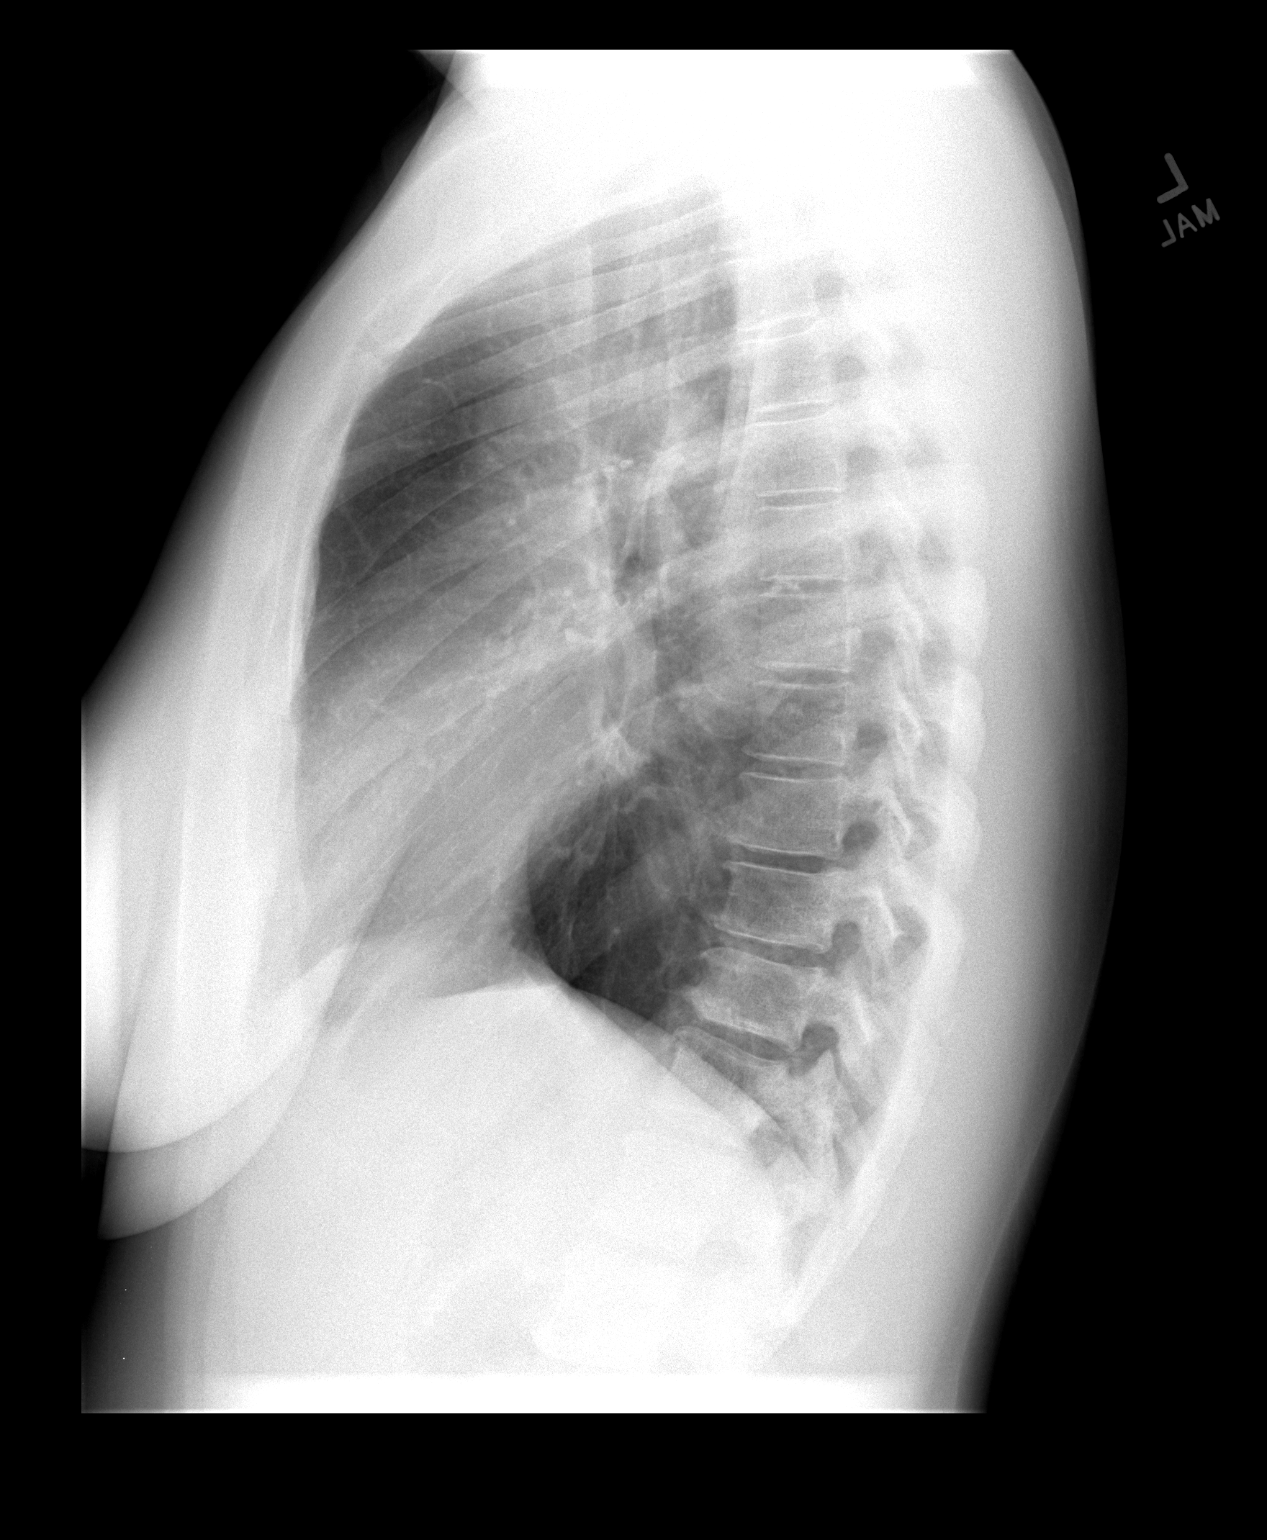

[2 of 2 positions shown; findings below may reference images not displayed]

FINDINGS: Lungs are clear. Heart size and pulmonary vascularity are normal. No
adenopathy. No pneumothorax. There is slight degenerative change in
the thoracic spine. There is mild thoracolumbar dextroscoliosis.

Incidental note is made of spina bifida occulta at T1.
IMPRESSION: No edema or consolidation.

## 2014-08-05 IMAGING — CR DG NECK SOFT TISSUE
2 series · 2 of 2 positions shown · non-contrast
Comparison: None.

CLINICAL DATA: Difficulty swallowing for 2 days.

EXAM:
NECK SOFT TISSUES - 1+ VIEW

[view not recorded (1 of 2)]
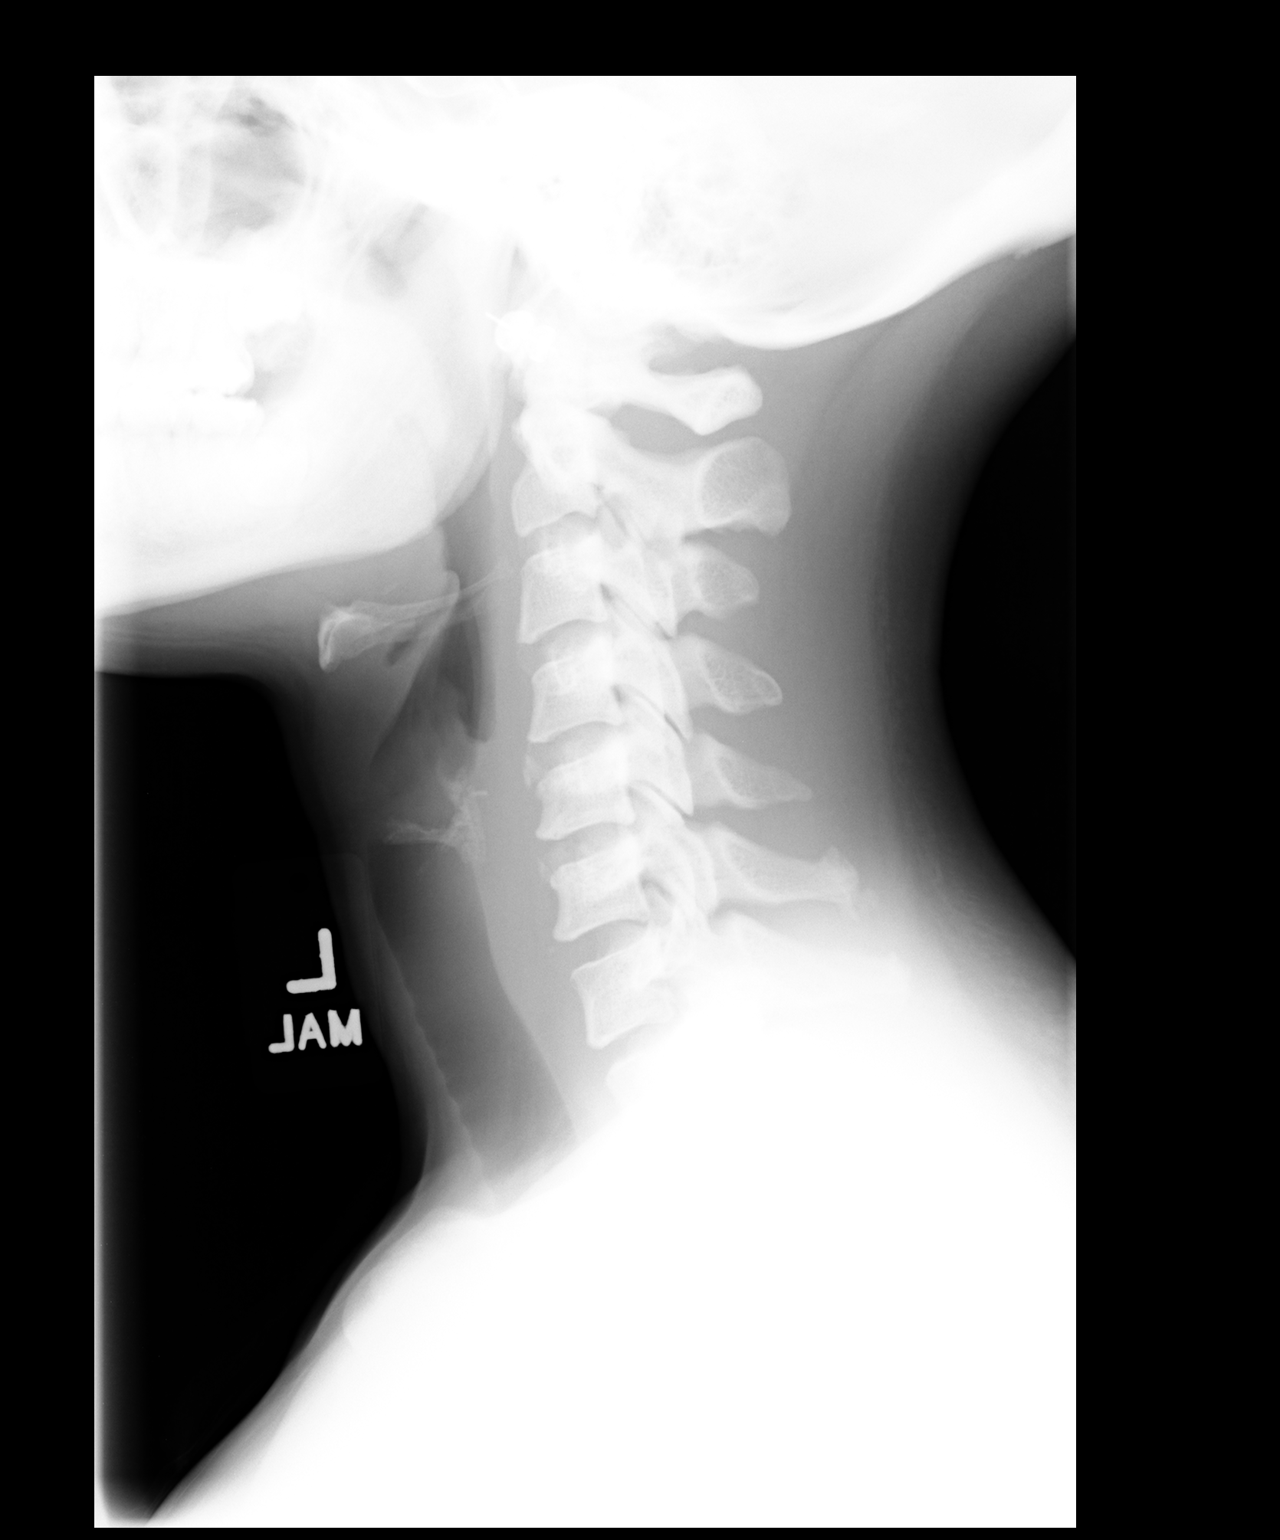

[view not recorded (2 of 2)]
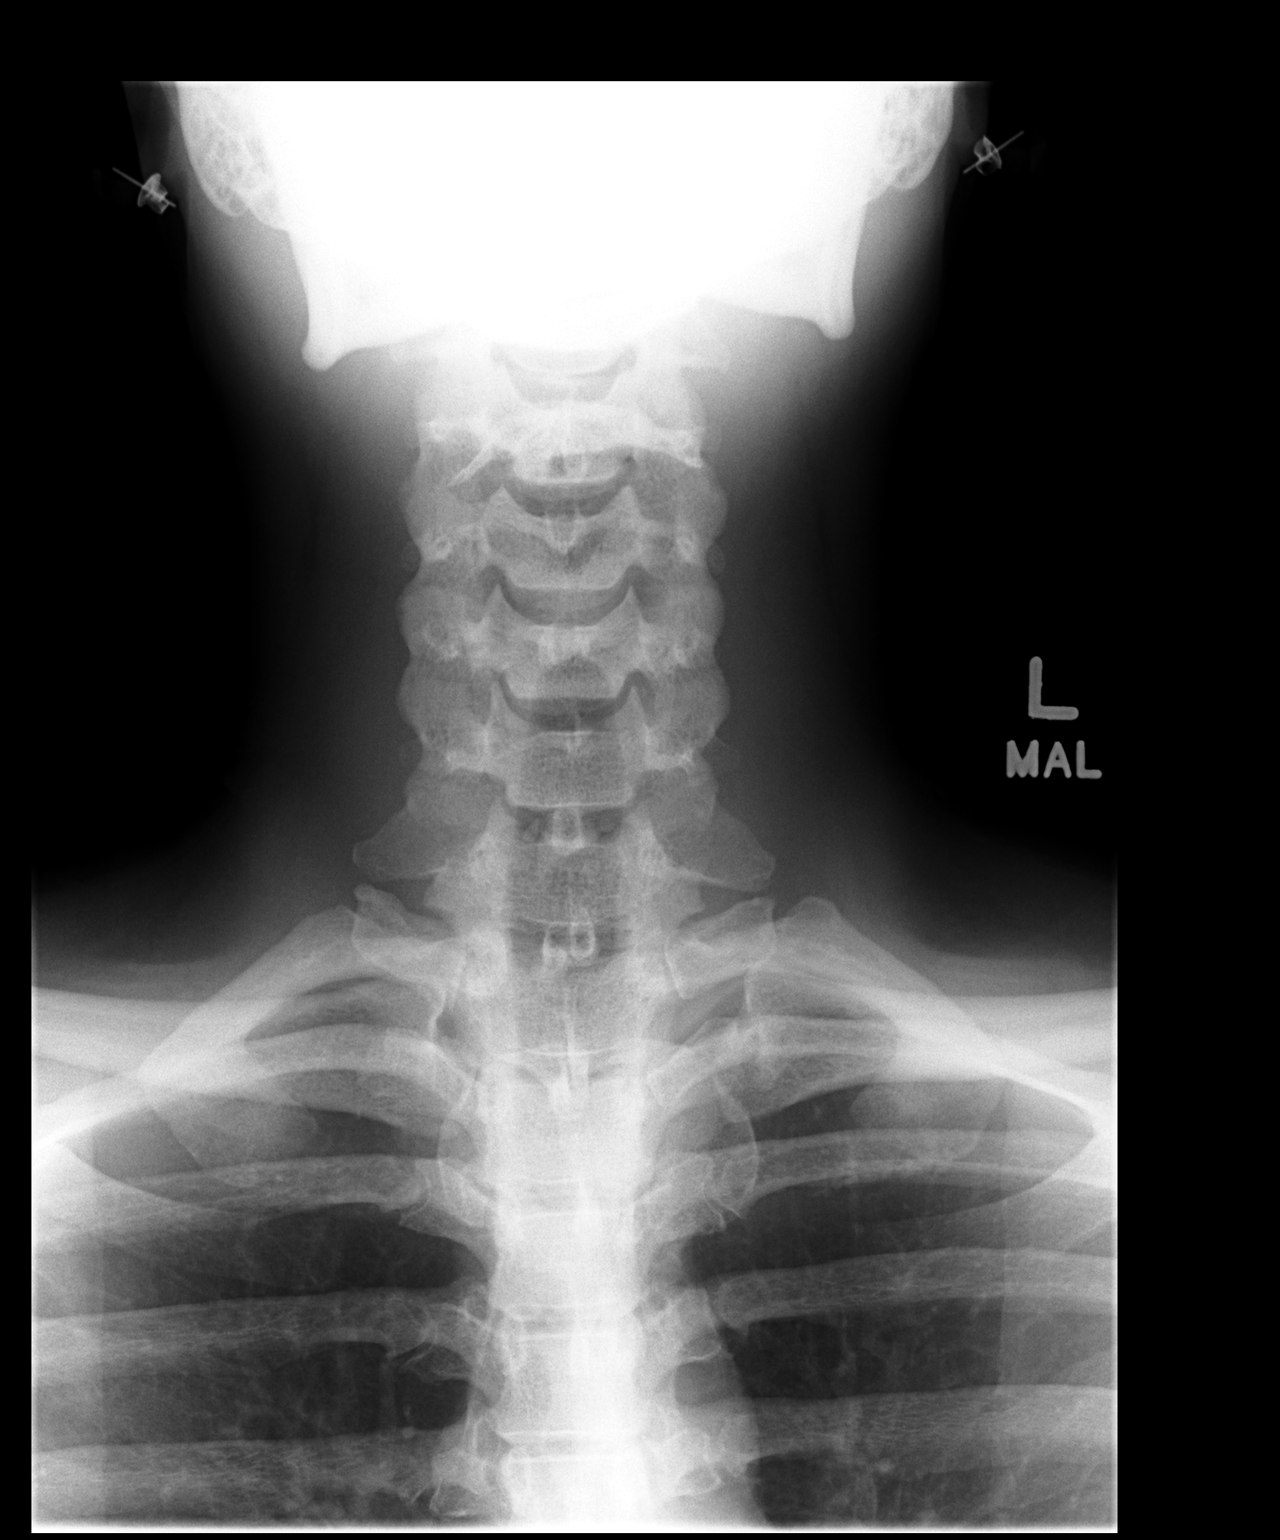

[2 of 2 positions shown; findings below may reference images not displayed]

FINDINGS: There is no evidence of retropharyngeal soft tissue swelling or
epiglottic enlargement. The cervical airway is unremarkable and no
radio-opaque foreign body identified. Annular calcification is noted
in the midcervical spine.
IMPRESSION: Unremarkable appearance of the cervical airway.
